# Patient Record
Sex: Male | Born: 1995 | Race: Black or African American | Hispanic: No | Marital: Single | Smoking: Never smoker
Health system: Southern US, Community
[De-identification: ages and names within clinical notes are randomized; demographics above are authoritative.]

---
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
## 2018-01-05 ENCOUNTER — Emergency Department (HOSPITAL_COMMUNITY): Payer: POS

## 2018-01-05 ENCOUNTER — Emergency Department (HOSPITAL_COMMUNITY)
Admission: EM | Admit: 2018-01-05 | Discharge: 2018-01-05 | Disposition: A | Discharge status: Home / Self Care | Payer: POS | Attending: Emergency Medicine | Admitting: Emergency Medicine

## 2018-01-05 ENCOUNTER — Other Ambulatory Visit: Payer: Self-pay

## 2018-01-05 ENCOUNTER — Encounter (HOSPITAL_COMMUNITY): Payer: Self-pay | Admitting: Emergency Medicine

## 2018-01-05 DIAGNOSIS — S0181XA Laceration without foreign body of other part of head, initial encounter: Secondary | ICD-10-CM

## 2018-01-05 DIAGNOSIS — Y939 Activity, unspecified: Secondary | ICD-10-CM | POA: Diagnosis not present

## 2018-01-05 DIAGNOSIS — Y998 Other external cause status: Secondary | ICD-10-CM | POA: Insufficient documentation

## 2018-01-05 DIAGNOSIS — Z23 Encounter for immunization: Secondary | ICD-10-CM | POA: Diagnosis not present

## 2018-01-05 DIAGNOSIS — S20212A Contusion of left front wall of thorax, initial encounter: Secondary | ICD-10-CM | POA: Insufficient documentation

## 2018-01-05 DIAGNOSIS — Y92410 Unspecified street and highway as the place of occurrence of the external cause: Secondary | ICD-10-CM | POA: Insufficient documentation

## 2018-01-05 DIAGNOSIS — S0990XA Unspecified injury of head, initial encounter: Secondary | ICD-10-CM | POA: Diagnosis present

## 2018-01-05 LAB — COMPREHENSIVE METABOLIC PANEL
ALBUMIN: 4.2 g/dL (ref 3.5–5.0)
ALK PHOS: 45 U/L (ref 38–126)
ALT: 28 U/L (ref 17–63)
AST: 21 U/L (ref 15–41)
Anion gap: 9 (ref 5–15)
BUN: 12 mg/dL (ref 6–20)
CO2: 26 mmol/L (ref 22–32)
Calcium: 9.1 mg/dL (ref 8.9–10.3)
Chloride: 101 mmol/L (ref 101–111)
Creatinine, Ser: 1.22 mg/dL (ref 0.61–1.24)
GFR calc Af Amer: >60 mL/min (ref >60)
GFR calc non Af Amer: >60 mL/min (ref >60)
GLUCOSE: 111 mg/dL — AB (ref 65–99)
Potassium: 4.1 mmol/L (ref 3.5–5.1)
Sodium: 136 mmol/L (ref 135–145)
TOTAL PROTEIN: 7 g/dL (ref 6.5–8.1)
Total Bilirubin: 0.6 mg/dL (ref 0.3–1.2)

## 2018-01-05 LAB — CBC WITH DIFFERENTIAL/PLATELET
BASOS ABS: 0 10*3/uL (ref 0.0–0.1)
BASOS PCT: 0 %
Eosinophils Absolute: 0.2 10*3/uL (ref 0.0–0.7)
Eosinophils Relative: 2 %
HEMATOCRIT: 44.5 % (ref 39.0–52.0)
HEMOGLOBIN: 15.4 g/dL (ref 13.0–17.0)
Lymphocytes Relative: 18 %
Lymphs Abs: 1.8 10*3/uL (ref 0.7–4.0)
MCH: 31.4 pg (ref 26.0–34.0)
MCHC: 34.6 g/dL (ref 30.0–36.0)
MCV: 90.6 fL (ref 78.0–100.0)
MONOS PCT: 7 %
Monocytes Absolute: 0.6 10*3/uL (ref 0.1–1.0)
NEUTROS ABS: 7.3 10*3/uL (ref 1.7–7.7)
NEUTROS PCT: 73 %
Platelets: 262 10*3/uL (ref 150–400)
RBC: 4.91 MIL/uL (ref 4.22–5.81)
RDW: 12.1 % (ref 11.5–15.5)
WBC: 9.9 10*3/uL (ref 4.0–10.5)

## 2018-01-05 LAB — ETHANOL: Alcohol, Ethyl (B): <10 mg/dL (ref <10)

## 2018-01-05 MED ORDER — LIDOCAINE-EPINEPHRINE (PF) 2 %-1:200000 IJ SOLN
20.0000 mL | Freq: Once | INTRAMUSCULAR | Status: AC
  Administered 2018-01-05: 20 mL via INTRADERMAL
  Filled 2018-01-05: qty 20

## 2018-01-05 MED ORDER — TETANUS-DIPHTH-ACELL PERTUSSIS 5-2.5-18.5 LF-MCG/0.5 IM SUSP
0.5000 mL | Freq: Once | INTRAMUSCULAR | Status: AC
  Administered 2018-01-05: 0.5 mL via INTRAMUSCULAR
  Filled 2018-01-05: qty 0.5
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
## 2018-01-05 NOTE — Discharge Instructions (Signed)
1/2 peroxide and water on a q tip to clean the area.  No rubbing, water can run over but no direct contact.  Return for fever, redness drainage.
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
